# Patient Record
Sex: Male | Born: 1980 | Race: White | Hispanic: No | Marital: Single
Health system: Southern US, Community
[De-identification: ages and names within clinical notes are randomized; demographics above are authoritative.]

---

## 2017-03-07 ENCOUNTER — Emergency Department (HOSPITAL_COMMUNITY): Payer: Medicare Other

## 2017-03-07 ENCOUNTER — Encounter (HOSPITAL_COMMUNITY): Payer: Self-pay | Admitting: Emergency Medicine

## 2017-03-07 ENCOUNTER — Emergency Department (HOSPITAL_COMMUNITY)
Admission: EM | Admit: 2017-03-07 | Discharge: 2017-03-08 | Disposition: A | Discharge status: Home / Self Care | Payer: Medicare Other | Attending: Emergency Medicine | Admitting: Emergency Medicine

## 2017-03-07 DIAGNOSIS — R29898 Other symptoms and signs involving the musculoskeletal system: Secondary | ICD-10-CM | POA: Diagnosis not present

## 2017-03-07 DIAGNOSIS — S299XXA Unspecified injury of thorax, initial encounter: Secondary | ICD-10-CM | POA: Diagnosis not present

## 2017-03-07 DIAGNOSIS — S3992XA Unspecified injury of lower back, initial encounter: Secondary | ICD-10-CM | POA: Diagnosis present

## 2017-03-07 DIAGNOSIS — S161XXA Strain of muscle, fascia and tendon at neck level, initial encounter: Secondary | ICD-10-CM | POA: Insufficient documentation

## 2017-03-07 DIAGNOSIS — S30811A Abrasion of abdominal wall, initial encounter: Secondary | ICD-10-CM | POA: Insufficient documentation

## 2017-03-07 DIAGNOSIS — S39012A Strain of muscle, fascia and tendon of lower back, initial encounter: Secondary | ICD-10-CM | POA: Insufficient documentation

## 2017-03-07 DIAGNOSIS — Y929 Unspecified place or not applicable: Secondary | ICD-10-CM | POA: Insufficient documentation

## 2017-03-07 DIAGNOSIS — Y999 Unspecified external cause status: Secondary | ICD-10-CM | POA: Insufficient documentation

## 2017-03-07 DIAGNOSIS — Y9389 Activity, other specified: Secondary | ICD-10-CM | POA: Insufficient documentation

## 2017-03-07 DIAGNOSIS — S90511A Abrasion, right ankle, initial encounter: Secondary | ICD-10-CM | POA: Insufficient documentation

## 2017-03-07 LAB — COMPREHENSIVE METABOLIC PANEL
ALBUMIN: 3.7 g/dL (ref 3.5–5.0)
ALT: 19 U/L (ref 17–63)
ANION GAP: 8 (ref 5–15)
AST: 21 U/L (ref 15–41)
Alkaline Phosphatase: 93 U/L (ref 38–126)
BILIRUBIN TOTAL: 0.7 mg/dL (ref 0.3–1.2)
BUN: 11 mg/dL (ref 6–20)
CHLORIDE: 111 mmol/L (ref 101–111)
CO2: 23 mmol/L (ref 22–32)
Calcium: 9.1 mg/dL (ref 8.9–10.3)
Creatinine, Ser: 1.05 mg/dL (ref 0.61–1.24)
GFR calc Af Amer: 48 mL/min — ABNORMAL LOW (ref >60)
GFR calc non Af Amer: 41 mL/min — ABNORMAL LOW (ref >60)
GLUCOSE: 112 mg/dL — AB (ref 65–99)
POTASSIUM: 4 mmol/L (ref 3.5–5.1)
SODIUM: 142 mmol/L (ref 135–145)
Total Protein: 5.9 g/dL — ABNORMAL LOW (ref 6.5–8.1)

## 2017-03-07 LAB — CBC
HEMATOCRIT: 41.2 % (ref 39.0–52.0)
HEMOGLOBIN: 14.3 g/dL (ref 13.0–17.0)
MCH: 31.4 pg (ref 26.0–34.0)
MCHC: 34.7 g/dL (ref 30.0–36.0)
MCV: 90.5 fL (ref 78.0–100.0)
Platelets: 280 10*3/uL (ref 150–400)
RBC: 4.55 MIL/uL (ref 4.22–5.81)
RDW: 13.5 % (ref 11.5–15.5)
WBC: 7.1 10*3/uL (ref 4.0–10.5)

## 2017-03-07 LAB — I-STAT CHEM 8, ED
BUN: 13 mg/dL (ref 6–20)
CALCIUM ION: 1.13 mmol/L — AB (ref 1.15–1.40)
Chloride: 110 mmol/L (ref 101–111)
Creatinine, Ser: 1 mg/dL (ref 0.61–1.24)
Glucose, Bld: 109 mg/dL — ABNORMAL HIGH (ref 65–99)
HEMATOCRIT: 40 % (ref 39.0–52.0)
HEMOGLOBIN: 13.6 g/dL (ref 13.0–17.0)
Potassium: 3.9 mmol/L (ref 3.5–5.1)
SODIUM: 143 mmol/L (ref 135–145)
TCO2: 23 mmol/L (ref 0–100)

## 2017-03-07 LAB — CDS SEROLOGY

## 2017-03-07 LAB — ETHANOL: Alcohol, Ethyl (B): <5 mg/dL (ref <5)

## 2017-03-07 LAB — I-STAT CG4 LACTIC ACID, ED: LACTIC ACID, VENOUS: 2.1 mmol/L — AB (ref 0.5–1.9)

## 2017-03-07 LAB — PROTIME-INR
INR: 1.06
Prothrombin Time: 13.9 seconds (ref 11.4–15.2)

## 2017-03-07 LAB — SAMPLE TO BLOOD BANK

## 2017-03-07 MED ORDER — LORAZEPAM 2 MG/ML IJ SOLN
1.0000 mg | Freq: Once | INTRAMUSCULAR | Status: AC
  Administered 2017-03-07: 1 mg via INTRAVENOUS
  Filled 2017-03-07: qty 1

## 2017-03-07 MED ORDER — IOPAMIDOL (ISOVUE-300) INJECTION 61%
INTRAVENOUS | Status: AC
  Administered 2017-03-07: 100 mL
  Filled 2017-03-07: qty 100

## 2017-03-07 MED ORDER — ACETAMINOPHEN 325 MG PO TABS
650.0000 mg | ORAL_TABLET | Freq: Once | ORAL | Status: AC
  Administered 2017-03-07: 650 mg via ORAL
  Filled 2017-03-07: qty 2

## 2017-03-07 NOTE — ED Notes (Signed)
Requested urine sample from pt ? ?

## 2017-03-07 NOTE — ED Notes (Signed)
Pt name is Lanell MatarRichard Ibrahim? (spelling unknown) Pt has given several types of spellings
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
## 2017-03-07 NOTE — ED Notes (Signed)
Pt unable to complete MRI, EDP aware.
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
## 2017-03-07 NOTE — ED Notes (Signed)
Pt to CT

## 2017-03-07 NOTE — ED Notes (Signed)
Pt states he cant find his jewelry. Went through pt belongings with him, showed him his necklace and braclet is in a urine cup in the belongings bag. Pt verbalized understanding

## 2017-03-08 ENCOUNTER — Emergency Department (HOSPITAL_COMMUNITY): Payer: Medicare Other

## 2017-03-08 MED ORDER — TRAMADOL HCL 50 MG PO TABS: 50.0000 mg | ORAL_TABLET | Freq: Four times a day (QID) | ORAL | 0 refills | Status: AC | PRN

## 2017-03-08 NOTE — Discharge Instructions (Signed)
Tramadol as prescribed as needed for pain.  Follow-up with your primary Dr. if not improving in the next week.

## 2017-03-08 NOTE — ED Notes (Signed)
Pt to MRI
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
